# Patient Record
Sex: Female | Born: 1950 | Race: White | Hispanic: No | Marital: Married
Health system: Southern US, Community
[De-identification: ages and names within clinical notes are randomized; demographics above are authoritative.]

## PROBLEM LIST (undated history)

## (undated) HISTORY — PX: BREAST CYST EXCISION: SHX579

---

## 2003-02-25 ENCOUNTER — Encounter: Admission: RE | Admit: 2003-02-25 | Discharge: 2003-02-25 | Payer: Self-pay | Admitting: *Deleted

## 2003-02-25 ENCOUNTER — Encounter: Payer: Self-pay | Admitting: *Deleted

## 2004-04-01 ENCOUNTER — Encounter: Admission: RE | Admit: 2004-04-01 | Discharge: 2004-04-01 | Payer: Self-pay | Admitting: *Deleted

## 2005-06-05 ENCOUNTER — Encounter: Admission: RE | Admit: 2005-06-05 | Discharge: 2005-06-05 | Payer: Self-pay | Admitting: *Deleted

## 2006-08-03 ENCOUNTER — Encounter: Admission: RE | Admit: 2006-08-03 | Discharge: 2006-08-03 | Payer: Self-pay | Admitting: Obstetrics and Gynecology

## 2007-10-04 ENCOUNTER — Encounter: Admission: RE | Admit: 2007-10-04 | Discharge: 2007-10-04 | Payer: Self-pay | Admitting: Obstetrics and Gynecology

## 2009-04-02 ENCOUNTER — Encounter: Admission: RE | Admit: 2009-04-02 | Discharge: 2009-04-02 | Payer: Self-pay | Admitting: Obstetrics & Gynecology

## 2010-04-01 ENCOUNTER — Encounter: Admission: RE | Admit: 2010-04-01 | Discharge: 2010-04-01 | Payer: Self-pay | Admitting: Obstetrics and Gynecology

## 2011-06-20 ENCOUNTER — Other Ambulatory Visit: Payer: Self-pay | Admitting: Obstetrics and Gynecology

## 2011-06-20 DIAGNOSIS — Z1231 Encounter for screening mammogram for malignant neoplasm of breast: Secondary | ICD-10-CM

## 2011-06-30 ENCOUNTER — Ambulatory Visit
Admission: RE | Admit: 2011-06-30 | Discharge: 2011-06-30 | Disposition: A | Discharge status: Home / Self Care | Payer: BC Managed Care – PPO | Source: Ambulatory Visit | Attending: Obstetrics and Gynecology | Admitting: Obstetrics and Gynecology

## 2011-06-30 DIAGNOSIS — Z1231 Encounter for screening mammogram for malignant neoplasm of breast: Secondary | ICD-10-CM

## 2012-09-10 ENCOUNTER — Other Ambulatory Visit: Payer: Self-pay

## 2012-09-10 DIAGNOSIS — Z1231 Encounter for screening mammogram for malignant neoplasm of breast: Secondary | ICD-10-CM

## 2012-10-11 ENCOUNTER — Ambulatory Visit: Payer: BC Managed Care – PPO

## 2012-11-15 ENCOUNTER — Ambulatory Visit
Admission: RE | Admit: 2012-11-15 | Discharge: 2012-11-15 | Disposition: A | Discharge status: Home / Self Care | Payer: BC Managed Care – PPO | Source: Ambulatory Visit

## 2012-11-15 DIAGNOSIS — Z1231 Encounter for screening mammogram for malignant neoplasm of breast: Secondary | ICD-10-CM

## 2014-01-15 ENCOUNTER — Other Ambulatory Visit: Payer: Self-pay

## 2014-01-15 DIAGNOSIS — Z1231 Encounter for screening mammogram for malignant neoplasm of breast: Secondary | ICD-10-CM

## 2014-01-23 ENCOUNTER — Ambulatory Visit: Payer: BC Managed Care – PPO

## 2014-01-30 ENCOUNTER — Other Ambulatory Visit: Payer: Self-pay | Admitting: Nurse Practitioner

## 2014-01-30 ENCOUNTER — Ambulatory Visit
Admission: RE | Admit: 2014-01-30 | Discharge: 2014-01-30 | Disposition: A | Discharge status: Home / Self Care | Payer: BC Managed Care – PPO | Source: Ambulatory Visit

## 2014-01-30 DIAGNOSIS — Z1231 Encounter for screening mammogram for malignant neoplasm of breast: Secondary | ICD-10-CM

## 2014-01-30 DIAGNOSIS — N63 Unspecified lump in unspecified breast: Secondary | ICD-10-CM

## 2014-02-20 ENCOUNTER — Other Ambulatory Visit: Payer: BC Managed Care – PPO

## 2014-02-27 ENCOUNTER — Other Ambulatory Visit: Payer: BC Managed Care – PPO

## 2014-03-05 ENCOUNTER — Encounter (INDEPENDENT_AMBULATORY_CARE_PROVIDER_SITE_OTHER): Payer: Self-pay

## 2014-03-05 ENCOUNTER — Ambulatory Visit
Admission: RE | Admit: 2014-03-05 | Discharge: 2014-03-05 | Disposition: A | Discharge status: Home / Self Care | Payer: BC Managed Care – PPO | Source: Ambulatory Visit | Attending: Nurse Practitioner | Admitting: Nurse Practitioner

## 2014-03-05 DIAGNOSIS — N63 Unspecified lump in unspecified breast: Secondary | ICD-10-CM

## 2014-03-10 ENCOUNTER — Telehealth: Payer: Self-pay | Admitting: Emergency Medicine

## 2014-03-10 NOTE — Telephone Encounter (Signed)
Please inform the [facility] so they can send a report to Dr. Sigmund HazelLisa Dominguez, as it appears the order was sent to our office in error.  I would then inform Dr. Wynonia LawmanLisa Dominguez's office of the breast issue as the patient intends to follow through with her for her breast care.  I would also suggest that the patient sign a release of records for our office so that Dr. Sigmund HazelLisa Dominguez can have patient's chart information.  A note will be added to EPIC if she is no longer a patient here.

## 2014-03-10 NOTE — Telephone Encounter (Signed)
Message copied by FASMarc Morgans, Janmarie Smoot L on Tue Mar 10, 2014 10:30 AM ------      Message from: Ria CommentGRUBB, PATRICIA R      Created: Thu Mar 05, 2014 12:54 PM       I am really confused about this report  - I did not send her and she has no notes in Epic about having a visit here.  Is this sent to wrong provider? ------

## 2014-03-10 NOTE — Telephone Encounter (Signed)
Spoke with patient. She states she went for an annual screening and advised the mammogram tech that she has had an area on her R breast with a "ropey" feel since 03/2013 screening mammogram. She reports that this has been evaluated by her pcp, Dr. Sigmund HazelLisa Miller.  Patient then was advised she needed diagnostic imaging and orders went sent to and signed by Lauro FranklinPatricia Rolen-Grubb, FNP. Patient reports that she does not use [facility] for her gyn needs and that her pcp Dr. Sigmund HazelLisa Miller [facility] sees her for all of her healthcare needs.   Patient has not been seen by our practice since 09/22/11.   Advised that per results, needs to be managed clinically and needs breast check. Patient states she has an appointment with her pcp Dr. Sigmund HazelLisa Miller next week and will discuss with her at this time. Patient states she requested records be sent to Dr. Sigmund HazelLisa Miller at the breast center but, Dr. Sigmund HazelLisa Miller is not cc on the results sent to our office.  Patient requests that her results be forwarded to Dr. Sigmund HazelLisa Miller so that she can discuss with her pcp.   Dr. Edward JollySilva,  Can you review and advise on how to proceed?

## 2014-03-11 NOTE — Telephone Encounter (Signed)
Called [facility], Spoke with Deanna ArtisKeisha, she will send records to Dr. Sigmund HazelLisa Miller for patient.   Called Dr. Gus RankinLisa Millers office and left a message with Jamesetta Sohyllis to give to Dr. Hyacinth MeekerMiller to advise that patient had diagnostic breast imaging and requires in office clinical management and breast check completed and will forward copies as patient chooses to not have appointment at this office going forward.  My name, title and return call number were given with the message.

## 2015-03-24 ENCOUNTER — Other Ambulatory Visit: Payer: Self-pay

## 2015-03-24 DIAGNOSIS — Z1231 Encounter for screening mammogram for malignant neoplasm of breast: Secondary | ICD-10-CM

## 2015-04-09 ENCOUNTER — Ambulatory Visit
Admission: RE | Admit: 2015-04-09 | Discharge: 2015-04-09 | Disposition: A | Discharge status: Home / Self Care | Payer: BLUE CROSS/BLUE SHIELD | Source: Ambulatory Visit

## 2015-04-09 DIAGNOSIS — Z1231 Encounter for screening mammogram for malignant neoplasm of breast: Secondary | ICD-10-CM

## 2015-10-01 DIAGNOSIS — F410 Panic disorder [episodic paroxysmal anxiety] without agoraphobia: Secondary | ICD-10-CM | POA: Diagnosis not present

## 2015-10-01 DIAGNOSIS — Z0000 Encounter for general adult medical examination without abnormal findings: Secondary | ICD-10-CM | POA: Diagnosis not present

## 2015-10-01 DIAGNOSIS — B009 Herpesviral infection, unspecified: Secondary | ICD-10-CM | POA: Diagnosis not present

## 2015-10-01 DIAGNOSIS — Z209 Contact with and (suspected) exposure to unspecified communicable disease: Secondary | ICD-10-CM | POA: Diagnosis not present

## 2015-10-01 DIAGNOSIS — I10 Essential (primary) hypertension: Secondary | ICD-10-CM | POA: Diagnosis not present

## 2015-10-01 DIAGNOSIS — Z87891 Personal history of nicotine dependence: Secondary | ICD-10-CM | POA: Diagnosis not present

## 2015-10-01 DIAGNOSIS — Z136 Encounter for screening for cardiovascular disorders: Secondary | ICD-10-CM | POA: Diagnosis not present

## 2015-10-01 DIAGNOSIS — E039 Hypothyroidism, unspecified: Secondary | ICD-10-CM | POA: Diagnosis not present

## 2015-10-20 DIAGNOSIS — M5136 Other intervertebral disc degeneration, lumbar region: Secondary | ICD-10-CM | POA: Diagnosis not present

## 2015-11-23 DIAGNOSIS — I10 Essential (primary) hypertension: Secondary | ICD-10-CM | POA: Diagnosis not present

## 2015-12-08 DIAGNOSIS — M5136 Other intervertebral disc degeneration, lumbar region: Secondary | ICD-10-CM | POA: Diagnosis not present

## 2016-01-24 DIAGNOSIS — I10 Essential (primary) hypertension: Secondary | ICD-10-CM | POA: Diagnosis not present

## 2016-01-24 DIAGNOSIS — Z23 Encounter for immunization: Secondary | ICD-10-CM | POA: Diagnosis not present

## 2016-05-24 ENCOUNTER — Other Ambulatory Visit: Payer: Self-pay | Admitting: Nurse Practitioner

## 2016-05-24 ENCOUNTER — Other Ambulatory Visit: Payer: Self-pay | Admitting: Family Medicine

## 2016-05-24 DIAGNOSIS — Z1231 Encounter for screening mammogram for malignant neoplasm of breast: Secondary | ICD-10-CM

## 2016-05-31 ENCOUNTER — Ambulatory Visit
Admission: RE | Admit: 2016-05-31 | Discharge: 2016-05-31 | Disposition: A | Discharge status: Home / Self Care | Payer: Medicare Other | Source: Ambulatory Visit | Attending: Family Medicine | Admitting: Family Medicine

## 2016-05-31 DIAGNOSIS — Z1231 Encounter for screening mammogram for malignant neoplasm of breast: Secondary | ICD-10-CM

## 2016-06-12 DIAGNOSIS — Z1211 Encounter for screening for malignant neoplasm of colon: Secondary | ICD-10-CM | POA: Diagnosis not present

## 2016-08-07 DIAGNOSIS — Z1211 Encounter for screening for malignant neoplasm of colon: Secondary | ICD-10-CM | POA: Diagnosis not present

## 2016-08-07 DIAGNOSIS — K640 First degree hemorrhoids: Secondary | ICD-10-CM | POA: Diagnosis not present

## 2016-08-07 DIAGNOSIS — K6389 Other specified diseases of intestine: Secondary | ICD-10-CM | POA: Diagnosis not present

## 2016-09-20 DIAGNOSIS — Z961 Presence of intraocular lens: Secondary | ICD-10-CM | POA: Diagnosis not present

## 2016-10-06 ENCOUNTER — Other Ambulatory Visit: Payer: Self-pay | Admitting: Family Medicine

## 2016-10-06 DIAGNOSIS — B009 Herpesviral infection, unspecified: Secondary | ICD-10-CM | POA: Diagnosis not present

## 2016-10-06 DIAGNOSIS — M8580 Other specified disorders of bone density and structure, unspecified site: Secondary | ICD-10-CM

## 2016-10-06 DIAGNOSIS — Z131 Encounter for screening for diabetes mellitus: Secondary | ICD-10-CM | POA: Diagnosis not present

## 2016-10-06 DIAGNOSIS — Z0000 Encounter for general adult medical examination without abnormal findings: Secondary | ICD-10-CM | POA: Diagnosis not present

## 2016-10-06 DIAGNOSIS — F410 Panic disorder [episodic paroxysmal anxiety] without agoraphobia: Secondary | ICD-10-CM | POA: Diagnosis not present

## 2016-10-06 DIAGNOSIS — M85852 Other specified disorders of bone density and structure, left thigh: Secondary | ICD-10-CM | POA: Diagnosis not present

## 2016-10-06 DIAGNOSIS — I10 Essential (primary) hypertension: Secondary | ICD-10-CM | POA: Diagnosis not present

## 2016-10-06 DIAGNOSIS — E039 Hypothyroidism, unspecified: Secondary | ICD-10-CM | POA: Diagnosis not present

## 2016-10-13 ENCOUNTER — Inpatient Hospital Stay
Admission: RE | Admit: 2016-10-13 | Discharge: 2016-10-13 | Disposition: A | Discharge status: Home / Self Care | Payer: Medicare Other | Source: Ambulatory Visit | Attending: Family Medicine | Admitting: Family Medicine

## 2016-11-01 ENCOUNTER — Other Ambulatory Visit: Payer: Medicare Other

## 2016-12-06 ENCOUNTER — Ambulatory Visit
Admission: RE | Admit: 2016-12-06 | Discharge: 2016-12-06 | Disposition: A | Discharge status: Home / Self Care | Payer: Medicare Other | Source: Ambulatory Visit | Attending: Family Medicine | Admitting: Family Medicine

## 2016-12-06 DIAGNOSIS — M8589 Other specified disorders of bone density and structure, multiple sites: Secondary | ICD-10-CM | POA: Diagnosis not present

## 2016-12-06 DIAGNOSIS — Z780 Asymptomatic menopausal state: Secondary | ICD-10-CM | POA: Diagnosis not present

## 2016-12-06 DIAGNOSIS — M8580 Other specified disorders of bone density and structure, unspecified site: Secondary | ICD-10-CM

## 2017-02-19 DIAGNOSIS — E039 Hypothyroidism, unspecified: Secondary | ICD-10-CM | POA: Diagnosis not present

## 2017-02-26 DIAGNOSIS — Z23 Encounter for immunization: Secondary | ICD-10-CM | POA: Diagnosis not present

## 2017-06-13 ENCOUNTER — Other Ambulatory Visit: Payer: Self-pay | Admitting: Family Medicine

## 2017-06-13 DIAGNOSIS — Z1231 Encounter for screening mammogram for malignant neoplasm of breast: Secondary | ICD-10-CM

## 2017-07-06 ENCOUNTER — Ambulatory Visit
Admission: RE | Admit: 2017-07-06 | Discharge: 2017-07-06 | Disposition: A | Discharge status: Home / Self Care | Payer: Medicare Other | Source: Ambulatory Visit | Attending: Family Medicine | Admitting: Family Medicine

## 2017-07-06 DIAGNOSIS — Z1231 Encounter for screening mammogram for malignant neoplasm of breast: Secondary | ICD-10-CM | POA: Diagnosis not present

## 2017-10-30 DIAGNOSIS — I10 Essential (primary) hypertension: Secondary | ICD-10-CM | POA: Diagnosis not present

## 2017-10-30 DIAGNOSIS — Z0000 Encounter for general adult medical examination without abnormal findings: Secondary | ICD-10-CM | POA: Diagnosis not present

## 2017-10-30 DIAGNOSIS — R1013 Epigastric pain: Secondary | ICD-10-CM | POA: Diagnosis not present

## 2017-10-30 DIAGNOSIS — E039 Hypothyroidism, unspecified: Secondary | ICD-10-CM | POA: Diagnosis not present

## 2017-10-30 DIAGNOSIS — F410 Panic disorder [episodic paroxysmal anxiety] without agoraphobia: Secondary | ICD-10-CM | POA: Diagnosis not present

## 2017-10-30 DIAGNOSIS — B009 Herpesviral infection, unspecified: Secondary | ICD-10-CM | POA: Diagnosis not present

## 2017-10-30 DIAGNOSIS — Z6824 Body mass index (BMI) 24.0-24.9, adult: Secondary | ICD-10-CM | POA: Diagnosis not present

## 2017-10-30 DIAGNOSIS — M8580 Other specified disorders of bone density and structure, unspecified site: Secondary | ICD-10-CM | POA: Diagnosis not present

## 2017-10-30 DIAGNOSIS — Z23 Encounter for immunization: Secondary | ICD-10-CM | POA: Diagnosis not present

## 2017-10-30 DIAGNOSIS — Z1389 Encounter for screening for other disorder: Secondary | ICD-10-CM | POA: Diagnosis not present

## 2017-12-07 DIAGNOSIS — R1013 Epigastric pain: Secondary | ICD-10-CM | POA: Diagnosis not present

## 2017-12-07 DIAGNOSIS — Z6824 Body mass index (BMI) 24.0-24.9, adult: Secondary | ICD-10-CM | POA: Diagnosis not present

## 2018-03-08 DIAGNOSIS — E785 Hyperlipidemia, unspecified: Secondary | ICD-10-CM | POA: Diagnosis not present

## 2018-03-15 DIAGNOSIS — B009 Herpesviral infection, unspecified: Secondary | ICD-10-CM | POA: Diagnosis not present

## 2018-03-15 DIAGNOSIS — Z6824 Body mass index (BMI) 24.0-24.9, adult: Secondary | ICD-10-CM | POA: Diagnosis not present

## 2018-03-15 DIAGNOSIS — I10 Essential (primary) hypertension: Secondary | ICD-10-CM | POA: Diagnosis not present

## 2018-03-15 DIAGNOSIS — E785 Hyperlipidemia, unspecified: Secondary | ICD-10-CM | POA: Diagnosis not present

## 2018-03-15 DIAGNOSIS — R1013 Epigastric pain: Secondary | ICD-10-CM | POA: Diagnosis not present

## 2018-03-15 DIAGNOSIS — Z23 Encounter for immunization: Secondary | ICD-10-CM | POA: Diagnosis not present

## 2018-05-15 DIAGNOSIS — H524 Presbyopia: Secondary | ICD-10-CM | POA: Diagnosis not present

## 2018-05-15 DIAGNOSIS — Z961 Presence of intraocular lens: Secondary | ICD-10-CM | POA: Diagnosis not present

## 2018-05-15 DIAGNOSIS — H52221 Regular astigmatism, right eye: Secondary | ICD-10-CM | POA: Diagnosis not present

## 2018-05-15 DIAGNOSIS — H179 Unspecified corneal scar and opacity: Secondary | ICD-10-CM | POA: Diagnosis not present

## 2018-05-15 DIAGNOSIS — H04123 Dry eye syndrome of bilateral lacrimal glands: Secondary | ICD-10-CM | POA: Diagnosis not present

## 2018-05-15 DIAGNOSIS — H5211 Myopia, right eye: Secondary | ICD-10-CM | POA: Diagnosis not present

## 2018-09-09 ENCOUNTER — Other Ambulatory Visit: Payer: Self-pay | Admitting: Family Medicine

## 2018-09-09 DIAGNOSIS — Z1231 Encounter for screening mammogram for malignant neoplasm of breast: Secondary | ICD-10-CM

## 2018-10-11 DIAGNOSIS — E785 Hyperlipidemia, unspecified: Secondary | ICD-10-CM | POA: Diagnosis not present

## 2018-10-11 DIAGNOSIS — E039 Hypothyroidism, unspecified: Secondary | ICD-10-CM | POA: Diagnosis not present

## 2018-10-11 DIAGNOSIS — I10 Essential (primary) hypertension: Secondary | ICD-10-CM | POA: Diagnosis not present

## 2018-11-08 ENCOUNTER — Ambulatory Visit
Admission: RE | Admit: 2018-11-08 | Discharge: 2018-11-08 | Disposition: A | Discharge status: Home / Self Care | Payer: Medicare Other | Source: Ambulatory Visit | Attending: Family Medicine | Admitting: Family Medicine

## 2018-11-08 ENCOUNTER — Other Ambulatory Visit: Payer: Self-pay

## 2018-11-08 DIAGNOSIS — Z1231 Encounter for screening mammogram for malignant neoplasm of breast: Secondary | ICD-10-CM

## 2018-11-27 DIAGNOSIS — Z0000 Encounter for general adult medical examination without abnormal findings: Secondary | ICD-10-CM | POA: Diagnosis not present

## 2018-11-27 DIAGNOSIS — E039 Hypothyroidism, unspecified: Secondary | ICD-10-CM | POA: Diagnosis not present

## 2018-11-27 DIAGNOSIS — E785 Hyperlipidemia, unspecified: Secondary | ICD-10-CM | POA: Diagnosis not present

## 2018-11-27 DIAGNOSIS — F410 Panic disorder [episodic paroxysmal anxiety] without agoraphobia: Secondary | ICD-10-CM | POA: Diagnosis not present

## 2018-11-27 DIAGNOSIS — I10 Essential (primary) hypertension: Secondary | ICD-10-CM | POA: Diagnosis not present

## 2018-12-12 DIAGNOSIS — E039 Hypothyroidism, unspecified: Secondary | ICD-10-CM | POA: Diagnosis not present

## 2018-12-12 DIAGNOSIS — I10 Essential (primary) hypertension: Secondary | ICD-10-CM | POA: Diagnosis not present

## 2018-12-12 DIAGNOSIS — Z0000 Encounter for general adult medical examination without abnormal findings: Secondary | ICD-10-CM | POA: Diagnosis not present

## 2018-12-12 DIAGNOSIS — E785 Hyperlipidemia, unspecified: Secondary | ICD-10-CM | POA: Diagnosis not present

## 2018-12-12 DIAGNOSIS — F410 Panic disorder [episodic paroxysmal anxiety] without agoraphobia: Secondary | ICD-10-CM | POA: Diagnosis not present

## 2019-01-21 DIAGNOSIS — Z20828 Contact with and (suspected) exposure to other viral communicable diseases: Secondary | ICD-10-CM | POA: Diagnosis not present

## 2019-02-16 DIAGNOSIS — Z23 Encounter for immunization: Secondary | ICD-10-CM | POA: Diagnosis not present

## 2019-10-01 DIAGNOSIS — E039 Hypothyroidism, unspecified: Secondary | ICD-10-CM | POA: Diagnosis not present

## 2019-10-01 DIAGNOSIS — E785 Hyperlipidemia, unspecified: Secondary | ICD-10-CM | POA: Diagnosis not present

## 2019-10-01 DIAGNOSIS — I10 Essential (primary) hypertension: Secondary | ICD-10-CM | POA: Diagnosis not present

## 2019-11-10 ENCOUNTER — Other Ambulatory Visit: Payer: Self-pay | Admitting: Family Medicine

## 2019-11-10 DIAGNOSIS — Z1231 Encounter for screening mammogram for malignant neoplasm of breast: Secondary | ICD-10-CM

## 2019-12-08 ENCOUNTER — Other Ambulatory Visit: Payer: Self-pay

## 2019-12-08 ENCOUNTER — Ambulatory Visit
Admission: RE | Admit: 2019-12-08 | Discharge: 2019-12-08 | Disposition: A | Discharge status: Home / Self Care | Payer: Medicare Other | Source: Ambulatory Visit | Attending: Family Medicine | Admitting: Family Medicine

## 2019-12-08 DIAGNOSIS — Z1231 Encounter for screening mammogram for malignant neoplasm of breast: Secondary | ICD-10-CM

## 2020-01-09 DIAGNOSIS — E039 Hypothyroidism, unspecified: Secondary | ICD-10-CM | POA: Diagnosis not present

## 2020-01-09 DIAGNOSIS — E785 Hyperlipidemia, unspecified: Secondary | ICD-10-CM | POA: Diagnosis not present

## 2020-01-09 DIAGNOSIS — F410 Panic disorder [episodic paroxysmal anxiety] without agoraphobia: Secondary | ICD-10-CM | POA: Diagnosis not present

## 2020-01-09 DIAGNOSIS — Z79899 Other long term (current) drug therapy: Secondary | ICD-10-CM | POA: Diagnosis not present

## 2020-01-09 DIAGNOSIS — Z6824 Body mass index (BMI) 24.0-24.9, adult: Secondary | ICD-10-CM | POA: Diagnosis not present

## 2020-01-09 DIAGNOSIS — I10 Essential (primary) hypertension: Secondary | ICD-10-CM | POA: Diagnosis not present

## 2020-01-09 DIAGNOSIS — R739 Hyperglycemia, unspecified: Secondary | ICD-10-CM | POA: Diagnosis not present

## 2020-01-09 DIAGNOSIS — K219 Gastro-esophageal reflux disease without esophagitis: Secondary | ICD-10-CM | POA: Diagnosis not present

## 2020-01-19 DIAGNOSIS — Z0000 Encounter for general adult medical examination without abnormal findings: Secondary | ICD-10-CM | POA: Diagnosis not present

## 2020-01-23 DIAGNOSIS — Z961 Presence of intraocular lens: Secondary | ICD-10-CM | POA: Diagnosis not present

## 2020-01-23 DIAGNOSIS — H179 Unspecified corneal scar and opacity: Secondary | ICD-10-CM | POA: Diagnosis not present

## 2020-01-23 DIAGNOSIS — H04123 Dry eye syndrome of bilateral lacrimal glands: Secondary | ICD-10-CM | POA: Diagnosis not present

## 2020-01-23 DIAGNOSIS — H52221 Regular astigmatism, right eye: Secondary | ICD-10-CM | POA: Diagnosis not present

## 2020-01-23 DIAGNOSIS — H5211 Myopia, right eye: Secondary | ICD-10-CM | POA: Diagnosis not present

## 2020-01-23 DIAGNOSIS — H524 Presbyopia: Secondary | ICD-10-CM | POA: Diagnosis not present

## 2020-01-30 DIAGNOSIS — Z23 Encounter for immunization: Secondary | ICD-10-CM | POA: Diagnosis not present

## 2020-02-23 DIAGNOSIS — S0501XA Injury of conjunctiva and corneal abrasion without foreign body, right eye, initial encounter: Secondary | ICD-10-CM | POA: Diagnosis not present

## 2020-02-27 DIAGNOSIS — Z23 Encounter for immunization: Secondary | ICD-10-CM | POA: Diagnosis not present

## 2020-11-05 ENCOUNTER — Other Ambulatory Visit: Payer: Self-pay | Admitting: Family Medicine

## 2020-11-05 DIAGNOSIS — Z1231 Encounter for screening mammogram for malignant neoplasm of breast: Secondary | ICD-10-CM

## 2020-12-14 DIAGNOSIS — R5383 Other fatigue: Secondary | ICD-10-CM | POA: Diagnosis not present

## 2020-12-14 DIAGNOSIS — S80869A Insect bite (nonvenomous), unspecified lower leg, initial encounter: Secondary | ICD-10-CM | POA: Diagnosis not present

## 2020-12-14 DIAGNOSIS — W57XXXA Bitten or stung by nonvenomous insect and other nonvenomous arthropods, initial encounter: Secondary | ICD-10-CM | POA: Diagnosis not present

## 2020-12-14 DIAGNOSIS — M791 Myalgia, unspecified site: Secondary | ICD-10-CM | POA: Diagnosis not present

## 2020-12-27 DIAGNOSIS — M25552 Pain in left hip: Secondary | ICD-10-CM | POA: Diagnosis not present

## 2020-12-27 DIAGNOSIS — M25551 Pain in right hip: Secondary | ICD-10-CM | POA: Diagnosis not present

## 2020-12-27 DIAGNOSIS — M7061 Trochanteric bursitis, right hip: Secondary | ICD-10-CM | POA: Diagnosis not present

## 2020-12-27 DIAGNOSIS — M7062 Trochanteric bursitis, left hip: Secondary | ICD-10-CM | POA: Diagnosis not present

## 2021-01-07 DIAGNOSIS — M25551 Pain in right hip: Secondary | ICD-10-CM | POA: Diagnosis not present

## 2021-01-12 ENCOUNTER — Ambulatory Visit
Admission: RE | Admit: 2021-01-12 | Discharge: 2021-01-12 | Disposition: A | Discharge status: Home / Self Care | Payer: Medicare Other | Source: Ambulatory Visit | Attending: Family Medicine | Admitting: Family Medicine

## 2021-01-12 ENCOUNTER — Other Ambulatory Visit: Payer: Self-pay

## 2021-01-12 DIAGNOSIS — Z1231 Encounter for screening mammogram for malignant neoplasm of breast: Secondary | ICD-10-CM

## 2021-01-19 DIAGNOSIS — Z0000 Encounter for general adult medical examination without abnormal findings: Secondary | ICD-10-CM | POA: Diagnosis not present

## 2021-01-19 DIAGNOSIS — Z1389 Encounter for screening for other disorder: Secondary | ICD-10-CM | POA: Diagnosis not present

## 2021-01-21 DIAGNOSIS — B009 Herpesviral infection, unspecified: Secondary | ICD-10-CM | POA: Diagnosis not present

## 2021-01-21 DIAGNOSIS — Z23 Encounter for immunization: Secondary | ICD-10-CM | POA: Diagnosis not present

## 2021-01-21 DIAGNOSIS — I10 Essential (primary) hypertension: Secondary | ICD-10-CM | POA: Diagnosis not present

## 2021-01-21 DIAGNOSIS — E785 Hyperlipidemia, unspecified: Secondary | ICD-10-CM | POA: Diagnosis not present

## 2021-01-21 DIAGNOSIS — E039 Hypothyroidism, unspecified: Secondary | ICD-10-CM | POA: Diagnosis not present

## 2021-01-21 DIAGNOSIS — F410 Panic disorder [episodic paroxysmal anxiety] without agoraphobia: Secondary | ICD-10-CM | POA: Diagnosis not present

## 2021-01-21 DIAGNOSIS — K219 Gastro-esophageal reflux disease without esophagitis: Secondary | ICD-10-CM | POA: Diagnosis not present

## 2021-01-21 DIAGNOSIS — E559 Vitamin D deficiency, unspecified: Secondary | ICD-10-CM | POA: Diagnosis not present

## 2021-02-03 DIAGNOSIS — H5711 Ocular pain, right eye: Secondary | ICD-10-CM | POA: Diagnosis not present

## 2021-02-03 DIAGNOSIS — B0089 Other herpesviral infection: Secondary | ICD-10-CM | POA: Diagnosis not present

## 2021-02-03 DIAGNOSIS — H2000 Unspecified acute and subacute iridocyclitis: Secondary | ICD-10-CM | POA: Diagnosis not present

## 2021-02-07 DIAGNOSIS — H2000 Unspecified acute and subacute iridocyclitis: Secondary | ICD-10-CM | POA: Diagnosis not present

## 2021-02-07 DIAGNOSIS — B0089 Other herpesviral infection: Secondary | ICD-10-CM | POA: Diagnosis not present

## 2021-02-07 DIAGNOSIS — H5711 Ocular pain, right eye: Secondary | ICD-10-CM | POA: Diagnosis not present

## 2021-02-28 DIAGNOSIS — M7062 Trochanteric bursitis, left hip: Secondary | ICD-10-CM | POA: Diagnosis not present

## 2021-02-28 DIAGNOSIS — M7061 Trochanteric bursitis, right hip: Secondary | ICD-10-CM | POA: Diagnosis not present

## 2021-08-13 IMAGING — MG DIGITAL SCREENING BILAT W/ TOMO W/ CAD
6 of 10 series · 6 of 30 positions shown · non-contrast
Comparison: Previous exam(s).

CLINICAL DATA: Screening.

EXAM:
DIGITAL SCREENING BILATERAL MAMMOGRAM WITH TOMO AND CAD

[R CC synth-2D]
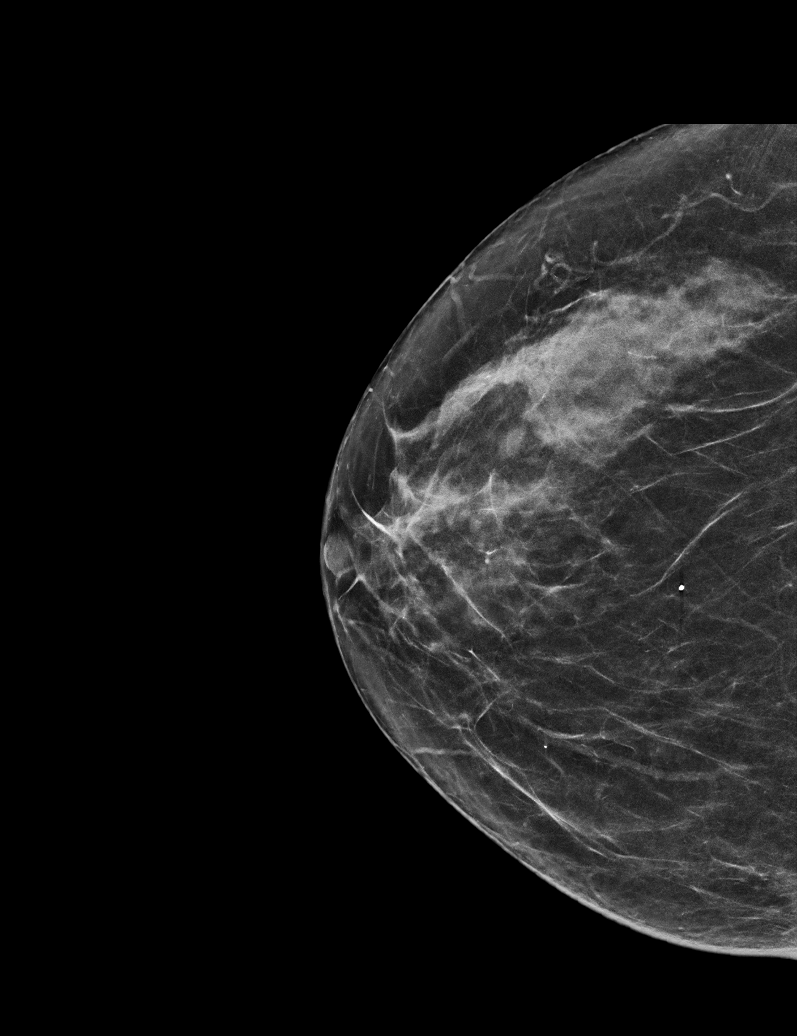

[R MLO synth-2D]
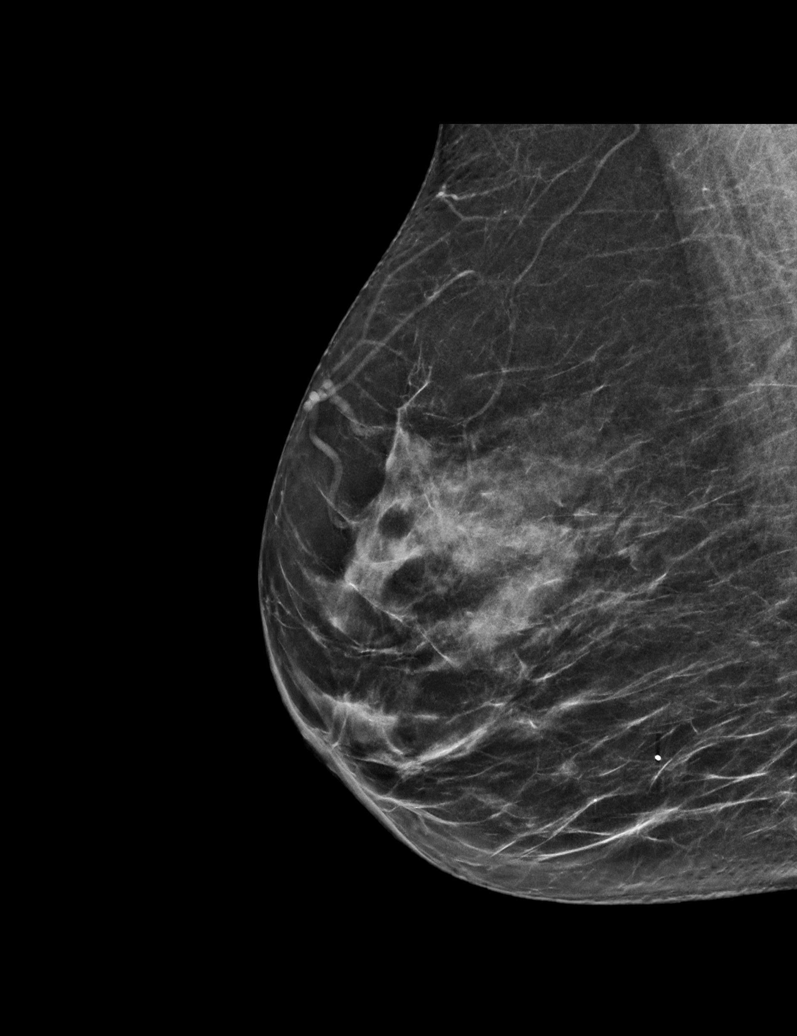

[L XCCL synth-2D]
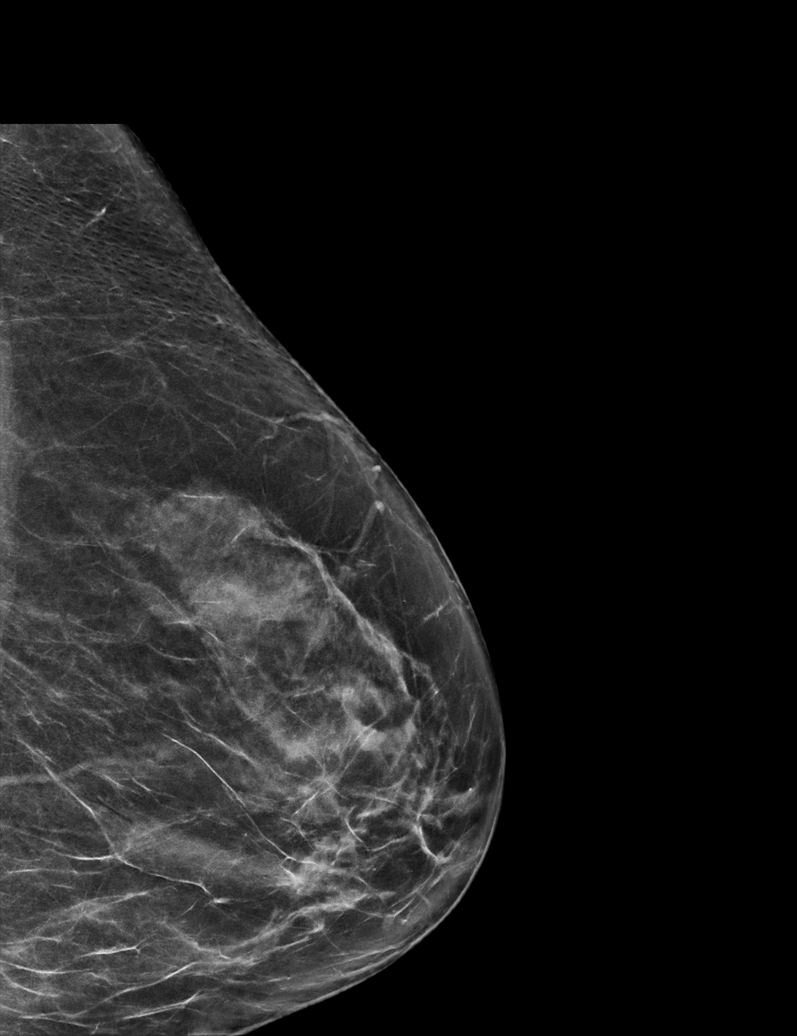

[L MLO synth-2D]
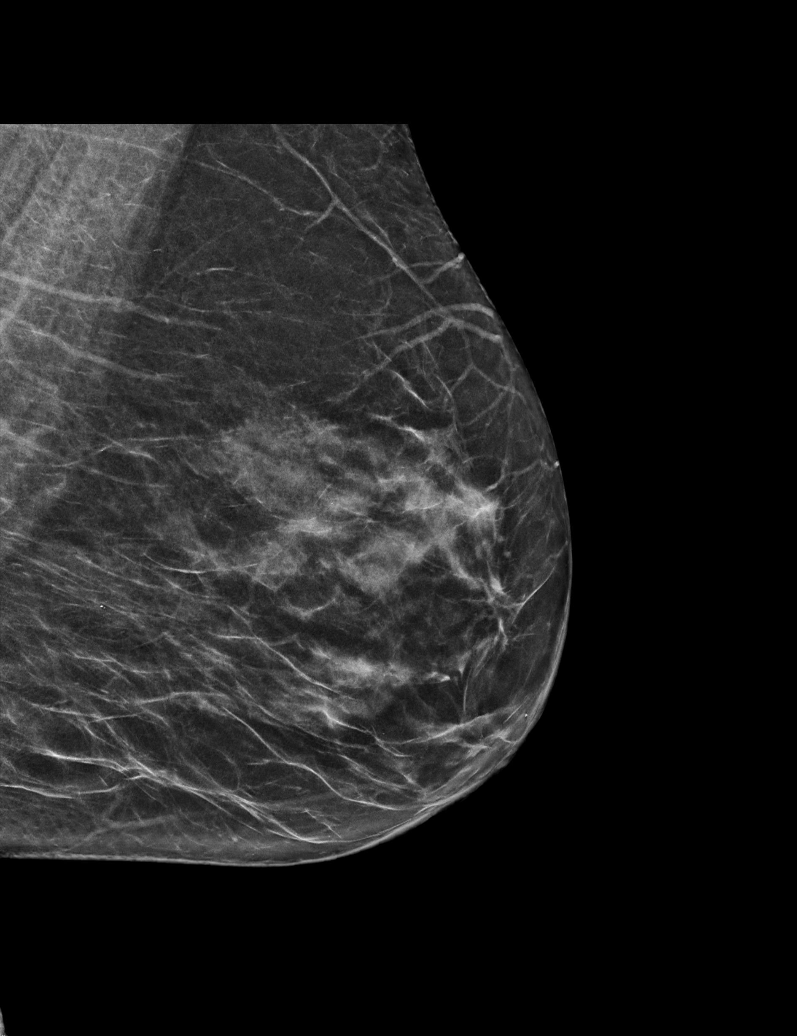

[L CC synth-2D]
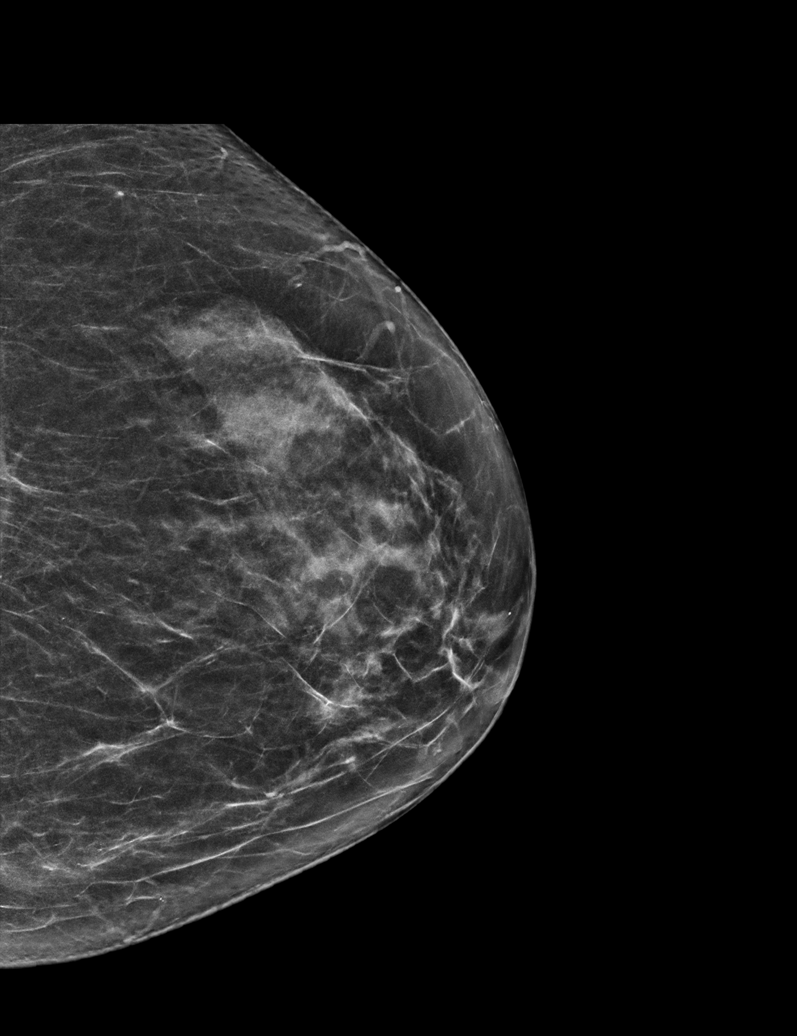

[L XCCL tomo · tomo slice 32/63.0]
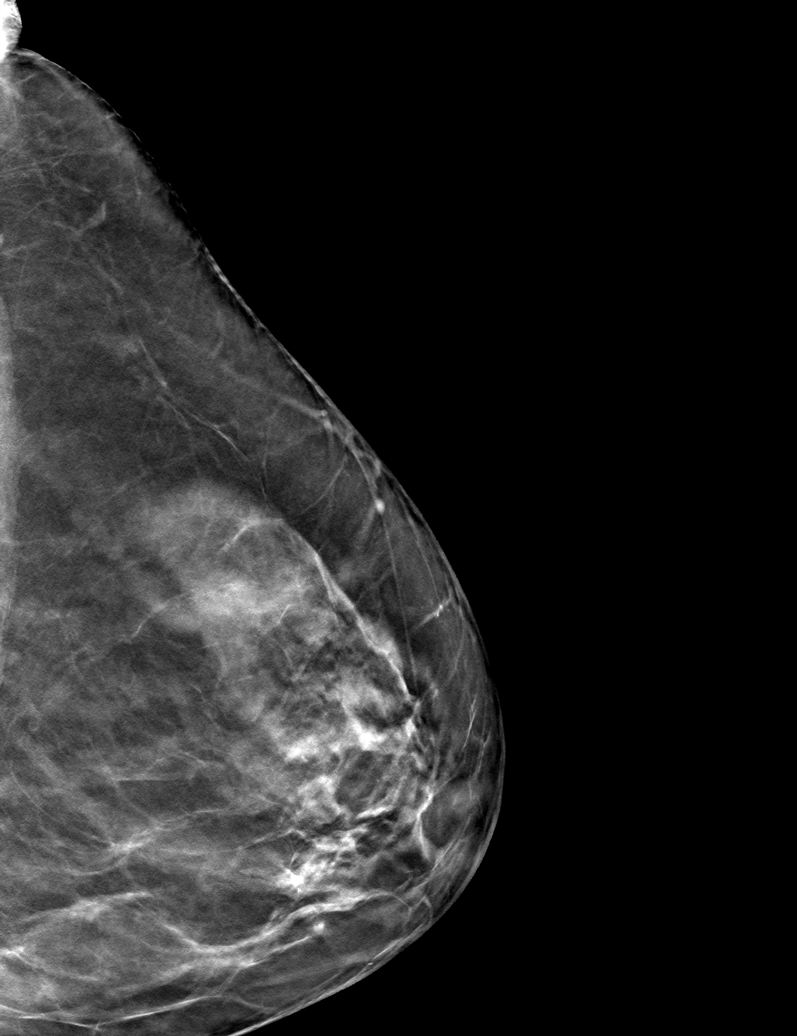

[6 of 30 positions shown; findings below may reference images not displayed]

ACR Breast Density Category c: The breast tissue is heterogeneously
dense, which may obscure small masses.
FINDINGS: There are no findings suspicious for malignancy. Images were
processed with CAD.
IMPRESSION: No mammographic evidence of malignancy. A result letter of this
screening mammogram will be mailed directly to the patient.

RECOMMENDATION:
Screening mammogram in one year. (Code:[id number])

BI-RADS CATEGORY  1: Negative.

## 2022-01-23 DIAGNOSIS — Z6826 Body mass index (BMI) 26.0-26.9, adult: Secondary | ICD-10-CM | POA: Diagnosis not present

## 2022-01-23 DIAGNOSIS — E785 Hyperlipidemia, unspecified: Secondary | ICD-10-CM | POA: Diagnosis not present

## 2022-01-23 DIAGNOSIS — E039 Hypothyroidism, unspecified: Secondary | ICD-10-CM | POA: Diagnosis not present

## 2022-01-23 DIAGNOSIS — I10 Essential (primary) hypertension: Secondary | ICD-10-CM | POA: Diagnosis not present

## 2022-01-23 DIAGNOSIS — F410 Panic disorder [episodic paroxysmal anxiety] without agoraphobia: Secondary | ICD-10-CM | POA: Diagnosis not present

## 2022-01-23 DIAGNOSIS — K219 Gastro-esophageal reflux disease without esophagitis: Secondary | ICD-10-CM | POA: Diagnosis not present

## 2022-02-20 DIAGNOSIS — Z0000 Encounter for general adult medical examination without abnormal findings: Secondary | ICD-10-CM | POA: Diagnosis not present

## 2022-02-20 DIAGNOSIS — Z23 Encounter for immunization: Secondary | ICD-10-CM | POA: Diagnosis not present

## 2022-02-20 DIAGNOSIS — Z1389 Encounter for screening for other disorder: Secondary | ICD-10-CM | POA: Diagnosis not present

## 2022-02-21 ENCOUNTER — Other Ambulatory Visit: Payer: Self-pay | Admitting: Family Medicine

## 2022-02-21 DIAGNOSIS — M8580 Other specified disorders of bone density and structure, unspecified site: Secondary | ICD-10-CM

## 2022-03-03 DIAGNOSIS — H524 Presbyopia: Secondary | ICD-10-CM | POA: Diagnosis not present

## 2022-03-03 DIAGNOSIS — H5213 Myopia, bilateral: Secondary | ICD-10-CM | POA: Diagnosis not present

## 2022-03-03 DIAGNOSIS — H26493 Other secondary cataract, bilateral: Secondary | ICD-10-CM | POA: Diagnosis not present

## 2022-03-03 DIAGNOSIS — H52222 Regular astigmatism, left eye: Secondary | ICD-10-CM | POA: Diagnosis not present

## 2022-03-03 DIAGNOSIS — Z961 Presence of intraocular lens: Secondary | ICD-10-CM | POA: Diagnosis not present

## 2022-03-30 DIAGNOSIS — H26493 Other secondary cataract, bilateral: Secondary | ICD-10-CM | POA: Diagnosis not present

## 2022-03-30 DIAGNOSIS — Z961 Presence of intraocular lens: Secondary | ICD-10-CM | POA: Diagnosis not present

## 2022-04-28 DIAGNOSIS — E782 Mixed hyperlipidemia: Secondary | ICD-10-CM | POA: Diagnosis not present

## 2022-04-28 DIAGNOSIS — E039 Hypothyroidism, unspecified: Secondary | ICD-10-CM | POA: Diagnosis not present

## 2022-05-01 ENCOUNTER — Other Ambulatory Visit: Payer: Self-pay | Admitting: Family Medicine

## 2022-05-01 DIAGNOSIS — Z1231 Encounter for screening mammogram for malignant neoplasm of breast: Secondary | ICD-10-CM

## 2022-06-16 DIAGNOSIS — H43811 Vitreous degeneration, right eye: Secondary | ICD-10-CM | POA: Diagnosis not present

## 2022-06-20 DIAGNOSIS — M25551 Pain in right hip: Secondary | ICD-10-CM | POA: Diagnosis not present

## 2022-06-20 DIAGNOSIS — M5459 Other low back pain: Secondary | ICD-10-CM | POA: Diagnosis not present

## 2022-06-26 ENCOUNTER — Ambulatory Visit: Payer: Medicare Other

## 2022-06-26 DIAGNOSIS — M47816 Spondylosis without myelopathy or radiculopathy, lumbar region: Secondary | ICD-10-CM | POA: Diagnosis not present

## 2022-06-26 DIAGNOSIS — M5416 Radiculopathy, lumbar region: Secondary | ICD-10-CM | POA: Diagnosis not present

## 2022-07-08 DIAGNOSIS — M5459 Other low back pain: Secondary | ICD-10-CM | POA: Diagnosis not present

## 2022-07-08 DIAGNOSIS — M5416 Radiculopathy, lumbar region: Secondary | ICD-10-CM | POA: Diagnosis not present

## 2022-07-18 DIAGNOSIS — M47896 Other spondylosis, lumbar region: Secondary | ICD-10-CM | POA: Diagnosis not present

## 2022-07-18 DIAGNOSIS — M533 Sacrococcygeal disorders, not elsewhere classified: Secondary | ICD-10-CM | POA: Diagnosis not present

## 2022-07-20 DIAGNOSIS — M533 Sacrococcygeal disorders, not elsewhere classified: Secondary | ICD-10-CM | POA: Diagnosis not present

## 2022-07-28 DIAGNOSIS — Z961 Presence of intraocular lens: Secondary | ICD-10-CM | POA: Diagnosis not present

## 2022-07-28 DIAGNOSIS — H524 Presbyopia: Secondary | ICD-10-CM | POA: Diagnosis not present

## 2022-07-28 DIAGNOSIS — H52222 Regular astigmatism, left eye: Secondary | ICD-10-CM | POA: Diagnosis not present

## 2022-07-28 DIAGNOSIS — H5213 Myopia, bilateral: Secondary | ICD-10-CM | POA: Diagnosis not present

## 2022-07-28 DIAGNOSIS — H26493 Other secondary cataract, bilateral: Secondary | ICD-10-CM | POA: Diagnosis not present

## 2022-07-28 DIAGNOSIS — H43811 Vitreous degeneration, right eye: Secondary | ICD-10-CM | POA: Diagnosis not present

## 2022-08-07 DIAGNOSIS — M47816 Spondylosis without myelopathy or radiculopathy, lumbar region: Secondary | ICD-10-CM | POA: Diagnosis not present

## 2022-08-07 DIAGNOSIS — M533 Sacrococcygeal disorders, not elsewhere classified: Secondary | ICD-10-CM | POA: Diagnosis not present

## 2022-08-09 ENCOUNTER — Ambulatory Visit
Admission: RE | Admit: 2022-08-09 | Discharge: 2022-08-09 | Disposition: A | Discharge status: Home / Self Care | Payer: Medicare Other | Source: Ambulatory Visit | Attending: Family Medicine | Admitting: Family Medicine

## 2022-08-09 DIAGNOSIS — Z1231 Encounter for screening mammogram for malignant neoplasm of breast: Secondary | ICD-10-CM | POA: Diagnosis not present

## 2022-09-06 ENCOUNTER — Inpatient Hospital Stay: Admission: RE | Admit: 2022-09-06 | Payer: Medicare Other | Source: Ambulatory Visit

## 2022-09-15 DIAGNOSIS — M8580 Other specified disorders of bone density and structure, unspecified site: Secondary | ICD-10-CM | POA: Diagnosis not present

## 2022-09-15 DIAGNOSIS — Z780 Asymptomatic menopausal state: Secondary | ICD-10-CM | POA: Diagnosis not present

## 2022-09-15 DIAGNOSIS — M85852 Other specified disorders of bone density and structure, left thigh: Secondary | ICD-10-CM | POA: Diagnosis not present

## 2022-10-31 DIAGNOSIS — M545 Low back pain, unspecified: Secondary | ICD-10-CM | POA: Diagnosis not present

## 2022-10-31 DIAGNOSIS — Z131 Encounter for screening for diabetes mellitus: Secondary | ICD-10-CM | POA: Diagnosis not present

## 2022-10-31 DIAGNOSIS — G8929 Other chronic pain: Secondary | ICD-10-CM | POA: Diagnosis not present

## 2022-10-31 DIAGNOSIS — Z7984 Long term (current) use of oral hypoglycemic drugs: Secondary | ICD-10-CM | POA: Diagnosis not present

## 2022-10-31 DIAGNOSIS — K219 Gastro-esophageal reflux disease without esophagitis: Secondary | ICD-10-CM | POA: Diagnosis not present

## 2022-10-31 DIAGNOSIS — B009 Herpesviral infection, unspecified: Secondary | ICD-10-CM | POA: Diagnosis not present

## 2022-10-31 DIAGNOSIS — I10 Essential (primary) hypertension: Secondary | ICD-10-CM | POA: Diagnosis not present

## 2022-10-31 DIAGNOSIS — E782 Mixed hyperlipidemia: Secondary | ICD-10-CM | POA: Diagnosis not present

## 2022-10-31 DIAGNOSIS — E039 Hypothyroidism, unspecified: Secondary | ICD-10-CM | POA: Diagnosis not present

## 2022-10-31 DIAGNOSIS — E785 Hyperlipidemia, unspecified: Secondary | ICD-10-CM | POA: Diagnosis not present

## 2022-10-31 DIAGNOSIS — Z79899 Other long term (current) drug therapy: Secondary | ICD-10-CM | POA: Diagnosis not present

## 2023-01-16 DIAGNOSIS — J029 Acute pharyngitis, unspecified: Secondary | ICD-10-CM | POA: Diagnosis not present

## 2023-01-16 DIAGNOSIS — H9202 Otalgia, left ear: Secondary | ICD-10-CM | POA: Diagnosis not present

## 2023-01-26 DIAGNOSIS — M8580 Other specified disorders of bone density and structure, unspecified site: Secondary | ICD-10-CM | POA: Diagnosis not present

## 2023-01-26 DIAGNOSIS — Z23 Encounter for immunization: Secondary | ICD-10-CM | POA: Diagnosis not present

## 2023-01-26 DIAGNOSIS — F410 Panic disorder [episodic paroxysmal anxiety] without agoraphobia: Secondary | ICD-10-CM | POA: Diagnosis not present

## 2023-01-26 DIAGNOSIS — E039 Hypothyroidism, unspecified: Secondary | ICD-10-CM | POA: Diagnosis not present

## 2023-01-26 DIAGNOSIS — B009 Herpesviral infection, unspecified: Secondary | ICD-10-CM | POA: Diagnosis not present

## 2023-01-26 DIAGNOSIS — K219 Gastro-esophageal reflux disease without esophagitis: Secondary | ICD-10-CM | POA: Diagnosis not present

## 2023-01-26 DIAGNOSIS — E559 Vitamin D deficiency, unspecified: Secondary | ICD-10-CM | POA: Diagnosis not present

## 2023-01-26 DIAGNOSIS — I10 Essential (primary) hypertension: Secondary | ICD-10-CM | POA: Diagnosis not present

## 2023-01-26 DIAGNOSIS — Z6825 Body mass index (BMI) 25.0-25.9, adult: Secondary | ICD-10-CM | POA: Diagnosis not present

## 2023-01-26 DIAGNOSIS — E785 Hyperlipidemia, unspecified: Secondary | ICD-10-CM | POA: Diagnosis not present

## 2023-03-14 DIAGNOSIS — Z1331 Encounter for screening for depression: Secondary | ICD-10-CM | POA: Diagnosis not present

## 2023-03-14 DIAGNOSIS — Z0000 Encounter for general adult medical examination without abnormal findings: Secondary | ICD-10-CM | POA: Diagnosis not present

## 2023-03-14 DIAGNOSIS — Z6825 Body mass index (BMI) 25.0-25.9, adult: Secondary | ICD-10-CM | POA: Diagnosis not present

## 2023-07-16 ENCOUNTER — Other Ambulatory Visit: Payer: Self-pay | Admitting: Family Medicine

## 2023-07-16 DIAGNOSIS — Z1231 Encounter for screening mammogram for malignant neoplasm of breast: Secondary | ICD-10-CM

## 2023-08-10 ENCOUNTER — Ambulatory Visit
Admission: RE | Admit: 2023-08-10 | Discharge: 2023-08-10 | Disposition: A | Discharge status: Home / Self Care | Payer: Medicare Other | Source: Ambulatory Visit | Attending: Family Medicine | Admitting: Family Medicine

## 2023-08-10 DIAGNOSIS — Z1231 Encounter for screening mammogram for malignant neoplasm of breast: Secondary | ICD-10-CM | POA: Diagnosis not present

## 2023-08-28 DIAGNOSIS — H43811 Vitreous degeneration, right eye: Secondary | ICD-10-CM | POA: Diagnosis not present

## 2023-08-28 DIAGNOSIS — H52222 Regular astigmatism, left eye: Secondary | ICD-10-CM | POA: Diagnosis not present

## 2023-08-28 DIAGNOSIS — H5213 Myopia, bilateral: Secondary | ICD-10-CM | POA: Diagnosis not present

## 2023-08-28 DIAGNOSIS — H524 Presbyopia: Secondary | ICD-10-CM | POA: Diagnosis not present

## 2023-08-28 DIAGNOSIS — Z961 Presence of intraocular lens: Secondary | ICD-10-CM | POA: Diagnosis not present

## 2023-09-24 DIAGNOSIS — R42 Dizziness and giddiness: Secondary | ICD-10-CM | POA: Diagnosis not present

## 2023-12-05 DIAGNOSIS — M67431 Ganglion, right wrist: Secondary | ICD-10-CM | POA: Diagnosis not present

## 2023-12-05 DIAGNOSIS — M79641 Pain in right hand: Secondary | ICD-10-CM | POA: Diagnosis not present

## 2023-12-05 DIAGNOSIS — G5601 Carpal tunnel syndrome, right upper limb: Secondary | ICD-10-CM | POA: Diagnosis not present

## 2023-12-05 DIAGNOSIS — M1811 Unilateral primary osteoarthritis of first carpometacarpal joint, right hand: Secondary | ICD-10-CM | POA: Diagnosis not present

## 2024-01-29 DIAGNOSIS — B009 Herpesviral infection, unspecified: Secondary | ICD-10-CM | POA: Diagnosis not present

## 2024-01-29 DIAGNOSIS — F410 Panic disorder [episodic paroxysmal anxiety] without agoraphobia: Secondary | ICD-10-CM | POA: Diagnosis not present

## 2024-01-29 DIAGNOSIS — M8588 Other specified disorders of bone density and structure, other site: Secondary | ICD-10-CM | POA: Diagnosis not present

## 2024-01-29 DIAGNOSIS — E039 Hypothyroidism, unspecified: Secondary | ICD-10-CM | POA: Diagnosis not present

## 2024-01-29 DIAGNOSIS — Z23 Encounter for immunization: Secondary | ICD-10-CM | POA: Diagnosis not present

## 2024-01-29 DIAGNOSIS — I10 Essential (primary) hypertension: Secondary | ICD-10-CM | POA: Diagnosis not present

## 2024-01-29 DIAGNOSIS — K219 Gastro-esophageal reflux disease without esophagitis: Secondary | ICD-10-CM | POA: Diagnosis not present

## 2024-01-29 DIAGNOSIS — E785 Hyperlipidemia, unspecified: Secondary | ICD-10-CM | POA: Diagnosis not present

## 2024-01-29 DIAGNOSIS — Z6823 Body mass index (BMI) 23.0-23.9, adult: Secondary | ICD-10-CM | POA: Diagnosis not present

## 2024-02-06 DIAGNOSIS — M1811 Unilateral primary osteoarthritis of first carpometacarpal joint, right hand: Secondary | ICD-10-CM | POA: Diagnosis not present

## 2024-02-06 DIAGNOSIS — G5601 Carpal tunnel syndrome, right upper limb: Secondary | ICD-10-CM | POA: Diagnosis not present

## 2024-02-14 DIAGNOSIS — Z1331 Encounter for screening for depression: Secondary | ICD-10-CM | POA: Diagnosis not present

## 2024-02-14 DIAGNOSIS — Z0000 Encounter for general adult medical examination without abnormal findings: Secondary | ICD-10-CM | POA: Diagnosis not present

## 2024-02-14 DIAGNOSIS — Z23 Encounter for immunization: Secondary | ICD-10-CM | POA: Diagnosis not present

## 2024-02-25 DIAGNOSIS — L820 Inflamed seborrheic keratosis: Secondary | ICD-10-CM | POA: Diagnosis not present

## 2024-02-25 DIAGNOSIS — L219 Seborrheic dermatitis, unspecified: Secondary | ICD-10-CM | POA: Diagnosis not present

## 2024-02-25 DIAGNOSIS — D225 Melanocytic nevi of trunk: Secondary | ICD-10-CM | POA: Diagnosis not present

## 2024-05-07 DIAGNOSIS — E039 Hypothyroidism, unspecified: Secondary | ICD-10-CM | POA: Diagnosis not present
